# Patient Record
Sex: Male | Born: 2000 | Hispanic: Yes | Marital: Single | Smoking: Never smoker
Health system: Southern US, Community
[De-identification: ages and names within clinical notes are randomized; demographics above are authoritative.]

---
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
## 2022-06-13 ENCOUNTER — Emergency Department: Payer: Medicaid Other

## 2022-06-13 ENCOUNTER — Other Ambulatory Visit: Payer: Self-pay

## 2022-06-13 ENCOUNTER — Emergency Department
Admission: EM | Admit: 2022-06-13 | Discharge: 2022-06-13 | Disposition: A | Discharge status: Home / Self Care | Payer: Medicaid Other | Attending: Emergency Medicine | Admitting: Emergency Medicine

## 2022-06-13 DIAGNOSIS — F17200 Nicotine dependence, unspecified, uncomplicated: Secondary | ICD-10-CM | POA: Insufficient documentation

## 2022-06-13 DIAGNOSIS — R509 Fever, unspecified: Secondary | ICD-10-CM | POA: Diagnosis present

## 2022-06-13 DIAGNOSIS — J210 Acute bronchiolitis due to respiratory syncytial virus: Secondary | ICD-10-CM | POA: Insufficient documentation

## 2022-06-13 DIAGNOSIS — Z1152 Encounter for screening for COVID-19: Secondary | ICD-10-CM | POA: Insufficient documentation

## 2022-06-13 LAB — RESP PANEL BY RT-PCR (RSV, FLU A&B, COVID)  RVPGX2
Influenza A by PCR: NEGATIVE
Influenza B by PCR: NEGATIVE
Resp Syncytial Virus by PCR: POSITIVE — AB
SARS Coronavirus 2 by RT PCR: NEGATIVE

## 2022-06-13 MED ORDER — DEXAMETHASONE 10 MG/ML FOR PEDIATRIC ORAL USE
10.0000 mg | Freq: Once | INTRAMUSCULAR | Status: AC
  Administered 2022-06-13: 10 mg via ORAL
  Filled 2022-06-13: qty 1

## 2022-06-13 MED ORDER — ACETAMINOPHEN 325 MG PO TABS
650.0000 mg | ORAL_TABLET | Freq: Once | ORAL | Status: AC | PRN
  Administered 2022-06-13: 650 mg via ORAL
  Filled 2022-06-13: qty 2

## 2022-06-13 MED ORDER — ALBUTEROL SULFATE HFA 108 (90 BASE) MCG/ACT IN AERS: 2.0000 | INHALATION_SPRAY | RESPIRATORY_TRACT | 0 refills | Status: AC | PRN

## 2022-06-13 NOTE — ED Triage Notes (Signed)
Pt ambulatory to triage c/o flu like symptoms that started last night. Pt endorses cough, congestion fevers since. Denies CP and SOB. NAD at this time

## 2022-06-13 NOTE — ED Provider Notes (Signed)
[facility] Provider Note    Event Date/Time   First MD Initiated Contact with Patient 06/13/22 [phone]     (approximate)   History   Cough and Fever   HPI  Ian Castaneda. is a 21 y.o. male presents to the ED from home with a chief complaint of flulike symptoms which began last night.  Endorses nonproductive cough, nasal congestion, subjective fevers.  Denies headache, neck pain, chest pain, shortness of breath, nausea, vomiting or dizziness.     Past Medical History  History reviewed. No pertinent past medical history.   Active Problem List  There are no problems to display for this patient.    Past Surgical History  History reviewed. No pertinent surgical history.   Home Medications   Prior to Admission medications   Medication Sig Start Date End Date Taking? Authorizing Provider  albuterol (VENTOLIN HFA) 108 (90 Base) MCG/ACT inhaler Inhale 2 puffs into the lungs every 4 (four) hours as needed for wheezing or shortness of breath. 06/13/22  Yes Irean Hong, MD  famotidine (PEPCID) 20 MG tablet Take 1 tablet (20 mg total) by mouth 2 (two) times daily. 08/26/19   Sharman Cheek, MD  ibuprofen (MOTRIN IB) 200 MG tablet Take 3 tablets (600 mg total) by mouth every 6 (six) hours as needed. 08/26/19   Sharman Cheek, MD  loperamide (IMODIUM A-D) 2 MG tablet Take 2 tablets (4 mg total) by mouth 4 (four) times daily as needed for diarrhea or loose stools. 08/26/19   Sharman Cheek, MD  methylPREDNISolone (MEDROL DOSEPAK) 4 MG TBPK tablet Take Tapered dose as directed 04/07/21   Joni Reining, PA-C  ondansetron (ZOFRAN ODT) 4 MG disintegrating tablet Take 1 tablet (4 mg total) by mouth every 8 (eight) hours as needed for nausea or vomiting. 08/26/19   Sharman Cheek, MD     Allergies  Patient has no known allergies.   Family History  History reviewed. No pertinent family history.   Physical Exam  Triage Vital Signs: ED  Triage Vitals  Enc Vitals Group     BP --      Pulse Rate 06/13/22 0110 (!) 106     Resp 06/13/22 0110 (!) 22     Temp 06/13/22 0110 (!) 100.4 F (38 C)     Temp Source 06/13/22 0110 Oral     SpO2 06/13/22 0110 96 %     Weight 06/13/22 0111 (!) 306 lb (138.8 kg)     Height 06/13/22 0111 5\' 11"  (1.803 m)     Head Circumference --      Peak Flow --      Pain Score 06/13/22 0110 10     Pain Loc --      Pain Edu? --      Excl. in GC? --     Updated Vital Signs: Pulse (!) 106   Temp (!) 100.4 F (38 C) (Oral)   Resp (!) 22   Ht 5\' 11"  (1.803 m)   Wt (!) 138.8 kg   SpO2 96%   BMI 42.68 kg/m    General: Awake, no distress.  CV:  RRR.  Good peripheral perfusion.  Resp:  Normal effort.  CTAB. Abd:  Nontender.  No distention.  Other:  Nasal congestion noted.  Supple neck without meningismus.  No petechiae.   ED Results / Procedures / Treatments  Labs (all labs ordered are listed, but only abnormal results are displayed) Labs Reviewed  RESP PANEL  BY RT-PCR (RSV, FLU A&B, COVID)  RVPGX2 - Abnormal; Notable for the following components:      Result Value   Resp Syncytial Virus by PCR POSITIVE (*)    All other components within normal limits     EKG  None   RADIOLOGY I have independently visualized and interpreted patient's chest x-ray as well as noted the radiology interpretation:  X-ray: No acute cardiopulmonary process  Official radiology report(s): DG Chest 2 View  Result Date: 06/13/2022 CLINICAL DATA:  Cough and congestion EXAM: CHEST - 2 VIEW COMPARISON:  11/29/2013 FINDINGS: The heart size and mediastinal contours are within normal limits. Both lungs are clear. The visualized skeletal structures are unremarkable. IMPRESSION: No active cardiopulmonary disease. Electronically Signed   By: Minerva Fester M.D.   On: 06/13/2022 01:59     PROCEDURES:  Critical Care performed: No  Procedures   MEDICATIONS ORDERED IN ED: Medications  dexamethasone  (DECADRON) 10 MG/ML injection for Pediatric ORAL use 10 mg (has no administration in time range)  acetaminophen (TYLENOL) tablet 650 mg (650 mg Oral Given 06/13/22 0116)     IMPRESSION / MDM / ASSESSMENT AND PLAN / ED COURSE  I reviewed the triage vital signs and the nursing notes.                             21 year old male presenting with cold-like symptoms.  He is positive for RSV; chest x-ray is clear.  He is a smoker and endorses occasional wheezing.  Will dose Decadron, prescribed albuterol inhaler to use as needed.  Strict return precautions given.  Patient verbalizes understanding and agrees with plan of care.  Patient's presentation is most consistent with acute, uncomplicated illness.   FINAL CLINICAL IMPRESSION(S) / ED DIAGNOSES   Final diagnoses:  RSV bronchiolitis     Rx / DC Orders   ED Discharge Orders          Ordered    albuterol (VENTOLIN HFA) 108 (90 Base) MCG/ACT inhaler  Every 4 hours PRN        06/13/22 0518             Note:  This document was prepared using Dragon voice recognition software and may include unintentional dictation errors.   Irean Hong, MD 06/13/22 [phone]

## 2022-06-13 NOTE — ED Provider Triage Note (Signed)
Emergency Medicine Provider Triage Evaluation Note  Ian Castaneda. , a 21 y.o. male  was evaluated in triage.  Pt complains of bodyaches, fever, cough and congestion.  Review of Systems  Positive: Body aches, cough and congestion Negative: Chest pain, shortness of breath  Physical Exam  There were no vitals taken for this visit. Gen:   Awake, no distress   Resp:  Normal effort  MSK:   Moves extremities without difficulty  Other:  No rash  Medical Decision Making  Medically screening exam initiated at 1:07 AM.  Appropriate orders placed.  Ian Jethro Bolus. was informed that the remainder of the evaluation will be completed by another provider, this initial triage assessment does not replace that evaluation, and the importance of remaining in the ED until their evaluation is complete.  21 year old male presenting with cold-like symptoms.  Will obtain respiratory panel, chest x-ray while patient awaits treatment room.   Irean Hong, MD 06/13/22 [phone]

## 2022-06-13 NOTE — Discharge Instructions (Signed)
You may use albuterol inhaler 2 puffs every 4 hours as needed for cough/wheezing/difficulty breathing.  Return to the ER for worsening symptoms, persistent vomiting, difficulty breathing or other concerns.

## 2023-01-17 ENCOUNTER — Emergency Department
Admission: EM | Admit: 2023-01-17 | Discharge: 2023-01-18 | Disposition: A | Discharge status: Home / Self Care | Payer: Medicaid Other | Attending: Emergency Medicine | Admitting: Emergency Medicine

## 2023-01-17 ENCOUNTER — Emergency Department: Payer: Medicaid Other

## 2023-01-17 ENCOUNTER — Other Ambulatory Visit: Payer: Self-pay

## 2023-01-17 DIAGNOSIS — R0789 Other chest pain: Secondary | ICD-10-CM | POA: Diagnosis not present

## 2023-01-17 DIAGNOSIS — R079 Chest pain, unspecified: Secondary | ICD-10-CM | POA: Insufficient documentation

## 2023-01-17 DIAGNOSIS — I213 ST elevation (STEMI) myocardial infarction of unspecified site: Secondary | ICD-10-CM | POA: Diagnosis not present

## 2023-01-17 LAB — CBC
HCT: 43.7 % (ref 39.0–52.0)
Hemoglobin: 15.1 g/dL (ref 13.0–17.0)
MCH: 27.8 pg (ref 26.0–34.0)
MCHC: 34.6 g/dL (ref 30.0–36.0)
MCV: 80.3 fL (ref 80.0–100.0)
Platelets: 211 10*3/uL (ref 150–400)
RBC: 5.44 MIL/uL (ref 4.22–5.81)
RDW: 12 % (ref 11.5–15.5)
WBC: 11.9 10*3/uL — ABNORMAL HIGH (ref 4.0–10.5)
nRBC: 0 % (ref 0.0–0.2)

## 2023-01-17 LAB — BASIC METABOLIC PANEL
Anion gap: 14 (ref 5–15)
BUN: 18 mg/dL (ref 6–20)
CO2: 20 mmol/L — ABNORMAL LOW (ref 22–32)
Calcium: 9.5 mg/dL (ref 8.9–10.3)
Chloride: 100 mmol/L (ref 98–111)
Creatinine, Ser: 0.97 mg/dL (ref 0.61–1.24)
GFR, Estimated: >60 mL/min (ref >60)
Glucose, Bld: 93 mg/dL (ref 70–99)
Potassium: 3.3 mmol/L — ABNORMAL LOW (ref 3.5–5.1)
Sodium: 134 mmol/L — ABNORMAL LOW (ref 135–145)

## 2023-01-17 LAB — TROPONIN I (HIGH SENSITIVITY): Troponin I (High Sensitivity): 11 ng/L (ref <18)
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
## 2023-01-18 LAB — TROPONIN I (HIGH SENSITIVITY): Troponin I (High Sensitivity): 11 ng/L (ref <18)

## 2023-01-18 NOTE — ED Notes (Signed)
Pt self removed all monitoring, declined D/C VS

## 2023-01-18 NOTE — Discharge Instructions (Signed)
Please seek medical attention for any high fevers, chest pain, shortness of breath, change in behavior, persistent vomiting, bloody stool or any other new or concerning symptoms.  
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
## 2023-06-12 ENCOUNTER — Other Ambulatory Visit: Payer: Self-pay

## 2023-06-12 ENCOUNTER — Emergency Department: Payer: Medicaid Other

## 2023-06-12 ENCOUNTER — Emergency Department
Admission: EM | Admit: 2023-06-12 | Discharge: 2023-06-12 | Disposition: A | Discharge status: Home / Self Care | Payer: Medicaid Other | Attending: Emergency Medicine | Admitting: Emergency Medicine

## 2023-06-12 DIAGNOSIS — R509 Fever, unspecified: Secondary | ICD-10-CM | POA: Diagnosis not present

## 2023-06-12 DIAGNOSIS — Z20822 Contact with and (suspected) exposure to covid-19: Secondary | ICD-10-CM | POA: Diagnosis not present

## 2023-06-12 DIAGNOSIS — J180 Bronchopneumonia, unspecified organism: Secondary | ICD-10-CM | POA: Diagnosis not present

## 2023-06-12 DIAGNOSIS — R519 Headache, unspecified: Secondary | ICD-10-CM | POA: Diagnosis present

## 2023-06-12 DIAGNOSIS — J181 Lobar pneumonia, unspecified organism: Secondary | ICD-10-CM | POA: Diagnosis not present

## 2023-06-12 DIAGNOSIS — J189 Pneumonia, unspecified organism: Secondary | ICD-10-CM | POA: Diagnosis not present

## 2023-06-12 DIAGNOSIS — R059 Cough, unspecified: Secondary | ICD-10-CM | POA: Diagnosis not present

## 2023-06-12 LAB — RESP PANEL BY RT-PCR (RSV, FLU A&B, COVID)  RVPGX2
Influenza A by PCR: NEGATIVE
Influenza B by PCR: NEGATIVE
Resp Syncytial Virus by PCR: NEGATIVE
SARS Coronavirus 2 by RT PCR: NEGATIVE

## 2023-06-12 LAB — CBC
HCT: 46.6 % (ref 39.0–52.0)
Hemoglobin: 16.2 g/dL (ref 13.0–17.0)
MCH: 28.6 pg (ref 26.0–34.0)
MCHC: 34.8 g/dL (ref 30.0–36.0)
MCV: 82.3 fL (ref 80.0–100.0)
Platelets: 167 10*3/uL (ref 150–400)
RBC: 5.66 MIL/uL (ref 4.22–5.81)
RDW: 11.9 % (ref 11.5–15.5)
WBC: 16 10*3/uL — ABNORMAL HIGH (ref 4.0–10.5)
nRBC: 0 % (ref 0.0–0.2)

## 2023-06-12 LAB — LIPASE, BLOOD: Lipase: 29 U/L (ref 11–51)

## 2023-06-12 LAB — COMPREHENSIVE METABOLIC PANEL
ALT: 25 U/L (ref 0–44)
AST: 18 U/L (ref 15–41)
Albumin: 4.6 g/dL (ref 3.5–5.0)
Alkaline Phosphatase: 66 U/L (ref 38–126)
Anion gap: 12 (ref 5–15)
BUN: 11 mg/dL (ref 6–20)
CO2: 25 mmol/L (ref 22–32)
Calcium: 9.3 mg/dL (ref 8.9–10.3)
Chloride: 96 mmol/L — ABNORMAL LOW (ref 98–111)
Creatinine, Ser: 1.05 mg/dL (ref 0.61–1.24)
GFR, Estimated: >60 mL/min (ref >60)
Glucose, Bld: 121 mg/dL — ABNORMAL HIGH (ref 70–99)
Potassium: 3.6 mmol/L (ref 3.5–5.1)
Sodium: 133 mmol/L — ABNORMAL LOW (ref 135–145)
Total Bilirubin: 2.2 mg/dL — ABNORMAL HIGH (ref <1.2)
Total Protein: 8.4 g/dL — ABNORMAL HIGH (ref 6.5–8.1)

## 2023-06-12 MED ORDER — AMOXICILLIN 500 MG PO CAPS: 500.0000 mg | ORAL_CAPSULE | Freq: Three times a day (TID) | ORAL | 0 refills | Status: AC

## 2023-06-12 MED ORDER — ONDANSETRON 4 MG PO TBDP
4.0000 mg | ORAL_TABLET | Freq: Once | ORAL | Status: AC
  Administered 2023-06-12: 4 mg via ORAL
  Filled 2023-06-12: qty 1

## 2023-06-12 MED ORDER — HYDROCOD POLI-CHLORPHE POLI ER 10-8 MG/5ML PO SUER
5.0000 mL | Freq: Once | ORAL | Status: AC
  Administered 2023-06-12: 5 mL via ORAL
  Filled 2023-06-12: qty 5

## 2023-06-12 MED ORDER — ONDANSETRON HCL 4 MG/2ML IJ SOLN
4.0000 mg | Freq: Once | INTRAMUSCULAR | Status: DC
  Filled 2023-06-12: qty 2

## 2023-06-12 MED ORDER — AZITHROMYCIN 250 MG PO TABS: 250.0000 mg | ORAL_TABLET | Freq: Every day | ORAL | 0 refills | Status: AC

## 2023-06-12 MED ORDER — SODIUM CHLORIDE 0.9 % IV SOLN
1.0000 g | Freq: Once | INTRAVENOUS | Status: AC
  Administered 2023-06-12: 1 g via INTRAVENOUS
  Filled 2023-06-12: qty 10

## 2023-06-12 MED ORDER — ONDANSETRON 4 MG PO TBDP: 4.0000 mg | ORAL_TABLET | Freq: Three times a day (TID) | ORAL | 0 refills | Status: AC | PRN

## 2023-06-12 MED ORDER — SODIUM CHLORIDE 0.9 % IV BOLUS
1000.0000 mL | Freq: Once | INTRAVENOUS | Status: AC
  Administered 2023-06-12: 1000 mL via INTRAVENOUS

## 2023-06-12 MED ORDER — KETOROLAC TROMETHAMINE 30 MG/ML IJ SOLN
15.0000 mg | Freq: Once | INTRAMUSCULAR | Status: AC
  Administered 2023-06-12: 15 mg via INTRAVENOUS
  Filled 2023-06-12: qty 1

## 2023-06-12 MED ORDER — SODIUM CHLORIDE 0.9 % IV SOLN
500.0000 mg | Freq: Once | INTRAVENOUS | Status: AC
  Administered 2023-06-12: 500 mg via INTRAVENOUS
  Filled 2023-06-12: qty 5

## 2023-06-12 NOTE — Discharge Instructions (Signed)
Take and finish antibiotics as prescribed.  You may take cough and nausea medicine as needed.  Return to the ER for worsening symptoms, persistent vomiting, difficulty breathing or other concerns.

## 2023-06-12 NOTE — ED Notes (Signed)
Pt to ED with N/V and headache with intermittent fevers for two days. Denies SOB/chest pain/neuro changes.
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
## 2023-06-12 NOTE — ED Provider Notes (Signed)
[facility] Provider Note    Event Date/Time   First MD Initiated Contact with Patient 06/12/23 [phone]     (approximate)   History   Emesis and Fever   HPI  Ian Castaneda. is a 22 y.o. male who presents to the ED from home with a 3-day history of fever, body aches, headache, cough, congestion, posttussive emesis.  Multiple family member sick with similar symptoms, several with pneumonia.  Denies chest pain, shortness of breath, abdominal pain, dysuria or diarrhea.     Past Medical History  History reviewed. No pertinent past medical history.   Active Problem List  There are no active problems to display for this patient.    Past Surgical History  History reviewed. No pertinent surgical history.   Home Medications   Prior to Admission medications   Medication Sig Start Date End Date Taking? Authorizing Provider  amoxicillin (AMOXIL) 500 MG capsule Take 1 capsule (500 mg total) by mouth 3 (three) times daily. 06/12/23  Yes Irean Hong, MD  azithromycin (ZITHROMAX) 250 MG tablet Take 1 tablet (250 mg total) by mouth daily. 06/12/23  Yes Irean Hong, MD  ondansetron (ZOFRAN-ODT) 4 MG disintegrating tablet Take 1 tablet (4 mg total) by mouth every 8 (eight) hours as needed for nausea or vomiting. 06/12/23  Yes Irean Hong, MD  albuterol (VENTOLIN HFA) 108 (90 Base) MCG/ACT inhaler Inhale 2 puffs into the lungs every 4 (four) hours as needed for wheezing or shortness of breath. 06/13/22   Irean Hong, MD  famotidine (PEPCID) 20 MG tablet Take 1 tablet (20 mg total) by mouth 2 (two) times daily. 08/26/19   Sharman Cheek, MD  ibuprofen (MOTRIN IB) 200 MG tablet Take 3 tablets (600 mg total) by mouth every 6 (six) hours as needed. 08/26/19   Sharman Cheek, MD  loperamide (IMODIUM A-D) 2 MG tablet Take 2 tablets (4 mg total) by mouth 4 (four) times daily as needed for diarrhea or loose stools. 08/26/19   Sharman Cheek, MD   methylPREDNISolone (MEDROL DOSEPAK) 4 MG TBPK tablet Take Tapered dose as directed 04/07/21   Joni Reining, PA-C     Allergies  Patient has no known allergies.   Family History  History reviewed. No pertinent family history.   Physical Exam  Triage Vital Signs: ED Triage Vitals  Encounter Vitals Group     BP 06/12/23 0302 134/85     Systolic BP Percentile --      Diastolic BP Percentile --      Pulse Rate 06/12/23 0302 88     Resp 06/12/23 0302 20     Temp 06/12/23 0302 100.2 F (37.9 C)     Temp src --      SpO2 06/12/23 0302 96 %     Weight 06/12/23 0303 300 lb (136.1 kg)     Height 06/12/23 0303 5\' 11"  (1.803 m)     Head Circumference --      Peak Flow --      Pain Score 06/12/23 0303 7     Pain Loc --      Pain Education --      Exclude from Growth Chart --     Updated Vital Signs: BP 135/62   Pulse 98   Temp 100.2 F (37.9 C)   Resp 19   Ht 5\' 11"  (1.803 m)   Wt 136.1 kg   SpO2 96%   BMI 41.84 kg/m  General: Awake, no distress.  CV:  RRR.  Good peripheral perfusion.  Resp:  Normal effort.  Diminished, otherwise CTAB. Abd:  Nontender.  No distention.  Other:  Supple neck without evidence of meningismus.   ED Results / Procedures / Treatments  Labs (all labs ordered are listed, but only abnormal results are displayed) Labs Reviewed  CBC - Abnormal; Notable for the following components:      Result Value   WBC 16.0 (*)    All other components within normal limits  COMPREHENSIVE METABOLIC PANEL - Abnormal; Notable for the following components:   Sodium 133 (*)    Chloride 96 (*)    Glucose, Bld 121 (*)    Total Protein 8.4 (*)    Total Bilirubin 2.2 (*)    All other components within normal limits  RESP PANEL BY RT-PCR (RSV, FLU A&B, COVID)  RVPGX2  LIPASE, BLOOD     EKG  None   RADIOLOGY I have independently visualized and interpreted patient's imaging study as well as noted the radiology interpretation:  Chest x-ray: Left  lower lobe bronchopneumonia  Official radiology report(s): DG Chest 2 View Result Date: 06/12/2023 CLINICAL DATA:  22 year old male with history of fever and cough. Body aches. EXAM: CHEST - 2 VIEW COMPARISON:  Chest x-ray 01/17/2023. FINDINGS: Ill-defined opacity in the left lung base projecting over the spine on lateral view, suggesting developing airspace disease in the left lower lobe. Right lung is clear. No pleural effusions. No pneumothorax. No evidence of pulmonary edema. Heart size is normal. Upper mediastinal contours are within normal limits. IMPRESSION: 1. Left lower lobe bronchopneumonia. Followup PA and lateral chest X-ray is recommended in 3-4 weeks following trial of antibiotic therapy to ensure resolution and exclude underlying malignancy. Electronically Signed   By: Trudie Reed M.D.   On: 06/12/2023 05:49     PROCEDURES:  Critical Care performed: No  Procedures   MEDICATIONS ORDERED IN ED: Medications  ondansetron (ZOFRAN) injection 4 mg (4 mg Intravenous Not Given 06/12/23 0634)  azithromycin (ZITHROMAX) 500 mg in sodium chloride 0.9 % 250 mL IVPB (500 mg Intravenous New Bag/Given 06/12/23 0635)  ondansetron (ZOFRAN-ODT) disintegrating tablet 4 mg (4 mg Oral Given 06/12/23 0554)  sodium chloride 0.9 % bolus 1,000 mL (1,000 mLs Intravenous New Bag/Given 06/12/23 5956)  ketorolac (TORADOL) 30 MG/ML injection 15 mg (15 mg Intravenous Given 06/12/23 3875)  chlorpheniramine-HYDROcodone (TUSSIONEX) 10-8 MG/5ML suspension 5 mL (5 mLs Oral Given 06/12/23 6433)  cefTRIAXone (ROCEPHIN) 1 g in sodium chloride 0.9 % 100 mL IVPB (0 g Intravenous Stopped 06/12/23 2951)     IMPRESSION / MDM / ASSESSMENT AND PLAN / ED COURSE  I reviewed the triage vital signs and the nursing notes.                             22 year old male presenting with cold-like symptoms.  Differential diagnosis includes but is not limited to viral process such as COVID-19, influenza, RSV,  community-acquired pneumonia, etc.  I personally reviewed patient's records and note majority ED visits, last in 01/17/2023 for chest pain.  Patient's presentation is most consistent with acute complicated illness / injury requiring diagnostic workup.  Laboratory results remarkable for moderate leukocytosis with WBC 16, negative respiratory panel.  Pneumonia seen on chest x-ray.  Patient does not meet sepsis criteria.  Will administer IV fluids, Rocephin/azithromycin for antibiotics, IV Zofran for nausea, ketorolac for body aches, Tussionex for cough and reassess.  Clinical Course as of 06/12/23 0707  Wed Jun 12, 2023  0707 Feeling better. Will discharge home after completion of IV abx. Strict precautions given.  Patient verbalizes understanding and agrees with plan of care. [JS]    Clinical Course User Index [JS] Irean Hong, MD     FINAL CLINICAL IMPRESSION(S) / ED DIAGNOSES   Final diagnoses:  Community acquired pneumonia of left lower lobe of lung     Rx / DC Orders   ED Discharge Orders          Ordered    amoxicillin (AMOXIL) 500 MG capsule  3 times daily        06/12/23 0636    azithromycin (ZITHROMAX) 250 MG tablet  Daily        06/12/23 0636    ondansetron (ZOFRAN-ODT) 4 MG disintegrating tablet  Every 8 hours PRN        06/12/23 0636             Note:  This document was prepared using Dragon voice recognition software and may include unintentional dictation errors.   Irean Hong, MD 06/12/23 [phone]

## 2024-03-10 DIAGNOSIS — K0889 Other specified disorders of teeth and supporting structures: Secondary | ICD-10-CM | POA: Diagnosis not present
